# Patient Record
Sex: Female | Born: 1955 | Race: Black or African American | Hispanic: No | Marital: Married | Smoking: Never smoker
Health system: Southern US, Community
[De-identification: ages and names within clinical notes are randomized; demographics above are authoritative.]

## PROBLEM LIST (undated history)

## (undated) DIAGNOSIS — I10 Essential (primary) hypertension: Secondary | ICD-10-CM

## (undated) HISTORY — PX: TONSILLECTOMY: SUR1361

## (undated) HISTORY — PX: APPENDECTOMY: SHX54

## (undated) HISTORY — PX: CHOLECYSTECTOMY: SHX55

---

## 2014-04-22 ENCOUNTER — Other Ambulatory Visit: Payer: Self-pay | Admitting: Family Medicine

## 2014-04-22 DIAGNOSIS — R1031 Right lower quadrant pain: Secondary | ICD-10-CM

## 2014-05-07 ENCOUNTER — Ambulatory Visit
Admission: RE | Admit: 2014-05-07 | Discharge: 2014-05-07 | Disposition: A | Discharge status: Home / Self Care | Payer: Federal, State, Local not specified - PPO | Source: Ambulatory Visit | Attending: Family Medicine | Admitting: Family Medicine

## 2014-05-07 ENCOUNTER — Encounter (INDEPENDENT_AMBULATORY_CARE_PROVIDER_SITE_OTHER): Payer: Self-pay

## 2014-05-07 ENCOUNTER — Other Ambulatory Visit: Payer: Self-pay

## 2014-05-07 DIAGNOSIS — R1031 Right lower quadrant pain: Secondary | ICD-10-CM

## 2014-05-10 ENCOUNTER — Other Ambulatory Visit: Payer: Self-pay | Admitting: Family Medicine

## 2014-05-10 DIAGNOSIS — R160 Hepatomegaly, not elsewhere classified: Secondary | ICD-10-CM

## 2014-05-10 DIAGNOSIS — N83209 Unspecified ovarian cyst, unspecified side: Secondary | ICD-10-CM

## 2014-05-20 ENCOUNTER — Ambulatory Visit
Admission: RE | Admit: 2014-05-20 | Discharge: 2014-05-20 | Disposition: A | Discharge status: Home / Self Care | Payer: Federal, State, Local not specified - PPO | Source: Ambulatory Visit | Attending: Family Medicine | Admitting: Family Medicine

## 2014-05-20 DIAGNOSIS — R160 Hepatomegaly, not elsewhere classified: Secondary | ICD-10-CM

## 2014-05-20 DIAGNOSIS — N83209 Unspecified ovarian cyst, unspecified side: Secondary | ICD-10-CM

## 2014-05-20 MED ORDER — GADOBENATE DIMEGLUMINE 529 MG/ML IV SOLN
13.0000 mL | Freq: Once | INTRAVENOUS | Status: AC | PRN
  Administered 2014-05-20: 13 mL via INTRAVENOUS

## 2016-06-01 ENCOUNTER — Encounter (HOSPITAL_COMMUNITY): Payer: Self-pay

## 2016-06-01 ENCOUNTER — Emergency Department (HOSPITAL_COMMUNITY)
Admission: EM | Admit: 2016-06-01 | Discharge: 2016-06-02 | Disposition: A | Discharge status: Home / Self Care | Payer: Federal, State, Local not specified - PPO | Attending: Emergency Medicine | Admitting: Emergency Medicine

## 2016-06-01 ENCOUNTER — Emergency Department (HOSPITAL_COMMUNITY): Payer: Federal, State, Local not specified - PPO

## 2016-06-01 DIAGNOSIS — R51 Headache: Secondary | ICD-10-CM | POA: Diagnosis not present

## 2016-06-01 DIAGNOSIS — I10 Essential (primary) hypertension: Secondary | ICD-10-CM | POA: Diagnosis not present

## 2016-06-01 DIAGNOSIS — R519 Headache, unspecified: Secondary | ICD-10-CM

## 2016-06-01 HISTORY — DX: Essential (primary) hypertension: I10

## 2016-06-01 NOTE — ED Notes (Signed)
Pt presents to ER with c/o headache for the past few nights. Pt, however, denies any headache today or any headache currently. Pt free of complaints at this time.

## 2016-06-01 NOTE — ED Provider Notes (Signed)
[facility]PT Provider Note   CSN: [id number] Arrival date & time: 06/01/16  [id number]  First Provider Contact:  None       History   Chief Complaint Chief Complaint  Patient presents with  . Migraine    HPI Angela Rubio is a 60 y.o. female.  HPI Patient presents with left-sided headache for 2-3 days. Worse at nights. Gradual onset. Not associated with photophobia, nausea or vomiting. Denies any focal weakness or numbness. Denies nasal congestion, sinus pressure or sore throat. Has not had previously similar headaches. Does have a history of hypertension but states she's been compliant with her medication. Denies any trauma. No neck pain. Past Medical History:  Diagnosis Date  . Hypertension     There are no active problems to display for this patient.   Past Surgical History:  Procedure Laterality Date  . APPENDECTOMY    . CHOLECYSTECTOMY    . TONSILLECTOMY      OB History    No data available       Home Medications    Prior to Admission medications   Not on File    Family History History reviewed. No pertinent family history.  Social History Social History  Substance Use Topics  . Smoking status: Never Smoker  . Smokeless tobacco: Never Used  . Alcohol use No     Allergies   Review of patient's allergies indicates no known allergies.   Review of Systems Review of Systems  Constitutional: Negative for chills and fever.  HENT: Negative for congestion, sinus pressure and sore throat.   Eyes: Negative for photophobia and visual disturbance.  Respiratory: Negative for shortness of breath.   Cardiovascular: Negative for chest pain.  Gastrointestinal: Negative for abdominal pain, nausea and vomiting.  Musculoskeletal: Negative for back pain, gait problem and neck pain.  Skin: Negative for rash and wound.  Neurological: Positive for headaches. Negative for dizziness, syncope, weakness, light-headedness and numbness.  All other systems reviewed  and are negative.    Physical Exam Updated Vital Signs BP 156/90 (BP Location: Right Arm)   Pulse (!) 58   Temp 98.1 F (36.7 C) (Oral)   Resp 18   Ht 5\' 1"  (1.549 m)   Wt 145 lb 9.6 oz (66 kg)   SpO2 99%   BMI 27.51 kg/m   Physical Exam  Constitutional: She is oriented to person, place, and time. She appears well-developed and well-nourished.  HENT:  Head: Normocephalic and atraumatic.  Mouth/Throat: Oropharynx is clear and moist.  No scalp tenderness especially in the temporal artery region. No sinus tenderness with percussion.  Eyes: EOM are normal. Pupils are equal, round, and reactive to light.  Neck: Normal range of motion. Neck supple.  No meningismus  Cardiovascular: Normal rate and regular rhythm.  Exam reveals no gallop and no friction rub.   No murmur heard. Pulmonary/Chest: Effort normal and breath sounds normal. No respiratory distress. She has no wheezes. She has no rales. She exhibits no tenderness.  Abdominal: Soft. Bowel sounds are normal. There is no tenderness. There is no rebound and no guarding.  Musculoskeletal: Normal range of motion. She exhibits no edema or tenderness.  Neurological: She is alert and oriented to person, place, and time.  Patient is alert and oriented x3 with clear, goal oriented speech. Patient has 5/5 motor in all extremities. Sensation is intact to light touch. Bilateral finger-to-nose is normal with no signs of dysmetria. Patient has a normal gait and walks without assistance.  Skin: Skin  is warm and dry. No rash noted. No erythema.  Psychiatric: She has a normal mood and affect. Her behavior is normal.  Nursing note and vitals reviewed.    ED Treatments / Results  Labs (all labs ordered are listed, but only abnormal results are displayed) Labs Reviewed - No data to display  EKG  EKG Interpretation None       Radiology Ct Head Wo Contrast  Result Date: 06/02/2016 CLINICAL DATA:  Acute onset of sharp headache.  Initial  encounter. EXAM: CT HEAD WITHOUT CONTRAST TECHNIQUE: Contiguous axial images were obtained from the base of the skull through the vertex without intravenous contrast. COMPARISON:  None. FINDINGS: There is no evidence of acute infarction, mass lesion, or intra- or extra-axial hemorrhage on CT. The posterior fossa, including the cerebellum, brainstem and fourth ventricle, is within normal limits. The third and lateral ventricles, and basal ganglia are unremarkable in appearance. The cerebral hemispheres are symmetric in appearance, with normal gray-white differentiation. No mass effect or midline shift is seen. There is no evidence of fracture; visualized osseous structures are unremarkable in appearance. The orbits are within normal limits. The paranasal sinuses and mastoid air cells are well-aerated. No significant soft tissue abnormalities are seen. IMPRESSION: Unremarkable noncontrast CT of the head. Electronically Signed   By: Roanna Raider M.D.   On: 06/02/2016 01:00    Procedures Procedures (including critical care time)  Medications Ordered in ED Medications - No data to display   Initial Impression / Assessment and Plan / ED Course  I have reviewed the triage vital signs and the nursing notes.  Pertinent labs & imaging results that were available during my care of the patient were reviewed by me and considered in my medical decision making (see chart for details).  Clinical Course   Patient's well appearing. She has a normal neurologic exam. Low suspicion for intracranial abnormality. Given this is a new headache and patient's age will get imaging. Anticipate discharge home. Signed out to oncoming emergency physician pending CT.  Final Clinical Impressions(s) / ED Diagnoses   Final diagnoses:  Acute nonintractable headache, unspecified headache type    New Prescriptions There are no discharge medications for this patient.    Loren Racer, MD 06/03/16 1213

## 2016-06-01 NOTE — ED Notes (Signed)
Pt to CT at this time.

## 2016-06-01 NOTE — ED Triage Notes (Signed)
Onset 05-28-16 while laying down trying to sleep got headache on left side of head. Has happened nightly since.  Pt has not tried any medications.  Will get up during night, drink water and is able to return to sleep.  Pt feels Ok during day.  No other symptoms associated with headache.

## 2016-06-01 NOTE — ED Notes (Signed)
Pt OOB to bathroom

## 2016-06-02 NOTE — ED Notes (Signed)
Pt dc home with significant other 

## 2016-06-02 NOTE — Discharge Instructions (Signed)
Continue ibuprofen and tylenol as needed for pain.  Return for worsening symptoms, including worsening pain, intractable vomiting, new numbness weakness, vision or speech changes, difficulty walking or any other symptoms concerning to you.

## 2016-06-02 NOTE — ED Provider Notes (Signed)
Pending CT head at time of sign out. Ct head negative for acute processes. Patient to continue migraine treatment as outpatient.  Strict return and follow-up instructions reviewed. She expressed understanding of all discharge instructions and felt comfortable with the plan of care.    Lavera Guiseana Duo Cristol Engdahl, MD 06/02/16 [phone]

## 2016-06-04 DIAGNOSIS — I10 Essential (primary) hypertension: Secondary | ICD-10-CM | POA: Diagnosis not present

## 2016-06-04 DIAGNOSIS — M62838 Other muscle spasm: Secondary | ICD-10-CM | POA: Diagnosis not present

## 2016-10-13 DIAGNOSIS — J019 Acute sinusitis, unspecified: Secondary | ICD-10-CM | POA: Diagnosis not present

## 2016-12-01 ENCOUNTER — Telehealth: Payer: Self-pay

## 2016-12-01 NOTE — Telephone Encounter (Signed)
SENT NOTE TO SCHEDULING 

## 2016-12-10 ENCOUNTER — Ambulatory Visit: Payer: Federal, State, Local not specified - PPO | Admitting: Cardiovascular Disease

## 2017-04-04 DIAGNOSIS — E611 Iron deficiency: Secondary | ICD-10-CM | POA: Diagnosis not present

## 2017-04-04 DIAGNOSIS — E785 Hyperlipidemia, unspecified: Secondary | ICD-10-CM | POA: Diagnosis not present

## 2017-04-04 DIAGNOSIS — D72819 Decreased white blood cell count, unspecified: Secondary | ICD-10-CM | POA: Diagnosis not present

## 2017-04-04 DIAGNOSIS — I10 Essential (primary) hypertension: Secondary | ICD-10-CM | POA: Diagnosis not present

## 2017-04-04 DIAGNOSIS — R739 Hyperglycemia, unspecified: Secondary | ICD-10-CM | POA: Diagnosis not present

## 2017-10-03 DIAGNOSIS — Z79899 Other long term (current) drug therapy: Secondary | ICD-10-CM | POA: Diagnosis not present

## 2017-10-03 DIAGNOSIS — E876 Hypokalemia: Secondary | ICD-10-CM | POA: Diagnosis not present

## 2017-10-03 DIAGNOSIS — R0602 Shortness of breath: Secondary | ICD-10-CM | POA: Diagnosis not present

## 2017-10-03 DIAGNOSIS — I10 Essential (primary) hypertension: Secondary | ICD-10-CM | POA: Diagnosis not present

## 2018-11-27 DIAGNOSIS — Z6828 Body mass index (BMI) 28.0-28.9, adult: Secondary | ICD-10-CM | POA: Diagnosis not present

## 2018-11-27 DIAGNOSIS — I10 Essential (primary) hypertension: Secondary | ICD-10-CM | POA: Diagnosis not present

## 2019-01-23 DIAGNOSIS — R079 Chest pain, unspecified: Secondary | ICD-10-CM | POA: Diagnosis not present

## 2019-01-23 DIAGNOSIS — R05 Cough: Secondary | ICD-10-CM | POA: Diagnosis not present

## 2019-01-23 DIAGNOSIS — I10 Essential (primary) hypertension: Secondary | ICD-10-CM | POA: Diagnosis not present

## 2019-01-23 DIAGNOSIS — Z9119 Patient's noncompliance with other medical treatment and regimen: Secondary | ICD-10-CM | POA: Diagnosis not present

## 2019-01-23 DIAGNOSIS — Z9049 Acquired absence of other specified parts of digestive tract: Secondary | ICD-10-CM | POA: Diagnosis not present

## 2019-01-23 DIAGNOSIS — E876 Hypokalemia: Secondary | ICD-10-CM | POA: Diagnosis not present

## 2019-01-23 DIAGNOSIS — R0609 Other forms of dyspnea: Secondary | ICD-10-CM | POA: Diagnosis not present

## 2019-01-23 DIAGNOSIS — Z79899 Other long term (current) drug therapy: Secondary | ICD-10-CM | POA: Diagnosis not present

## 2019-01-23 DIAGNOSIS — R0600 Dyspnea, unspecified: Secondary | ICD-10-CM | POA: Diagnosis not present

## 2019-01-24 DIAGNOSIS — R079 Chest pain, unspecified: Secondary | ICD-10-CM | POA: Diagnosis not present

## 2019-01-24 DIAGNOSIS — R0609 Other forms of dyspnea: Secondary | ICD-10-CM | POA: Diagnosis not present

## 2019-01-24 DIAGNOSIS — I10 Essential (primary) hypertension: Secondary | ICD-10-CM | POA: Diagnosis not present

## 2019-01-24 DIAGNOSIS — E876 Hypokalemia: Secondary | ICD-10-CM | POA: Diagnosis not present

## 2019-01-31 DIAGNOSIS — Z09 Encounter for follow-up examination after completed treatment for conditions other than malignant neoplasm: Secondary | ICD-10-CM | POA: Diagnosis not present

## 2019-01-31 DIAGNOSIS — J301 Allergic rhinitis due to pollen: Secondary | ICD-10-CM | POA: Diagnosis not present

## 2019-01-31 DIAGNOSIS — I10 Essential (primary) hypertension: Secondary | ICD-10-CM | POA: Diagnosis not present

## 2019-05-30 DIAGNOSIS — Z136 Encounter for screening for cardiovascular disorders: Secondary | ICD-10-CM | POA: Diagnosis not present

## 2019-05-30 DIAGNOSIS — R7302 Impaired glucose tolerance (oral): Secondary | ICD-10-CM | POA: Diagnosis not present

## 2019-05-30 DIAGNOSIS — Z0000 Encounter for general adult medical examination without abnormal findings: Secondary | ICD-10-CM | POA: Diagnosis not present

## 2019-05-30 DIAGNOSIS — Z7289 Other problems related to lifestyle: Secondary | ICD-10-CM | POA: Diagnosis not present

## 2019-05-30 DIAGNOSIS — Z13220 Encounter for screening for lipoid disorders: Secondary | ICD-10-CM | POA: Diagnosis not present

## 2019-05-30 DIAGNOSIS — J8410 Pulmonary fibrosis, unspecified: Secondary | ICD-10-CM | POA: Diagnosis not present

## 2019-07-09 DIAGNOSIS — Z1231 Encounter for screening mammogram for malignant neoplasm of breast: Secondary | ICD-10-CM | POA: Diagnosis not present

## 2019-07-25 ENCOUNTER — Other Ambulatory Visit: Payer: Self-pay | Admitting: Family Medicine

## 2019-07-25 DIAGNOSIS — R921 Mammographic calcification found on diagnostic imaging of breast: Secondary | ICD-10-CM | POA: Diagnosis not present

## 2019-07-30 ENCOUNTER — Ambulatory Visit
Admission: RE | Admit: 2019-07-30 | Discharge: 2019-07-30 | Disposition: A | Discharge status: Home / Self Care | Payer: Federal, State, Local not specified - PPO | Source: Ambulatory Visit | Attending: Family Medicine | Admitting: Family Medicine

## 2019-07-30 ENCOUNTER — Other Ambulatory Visit: Payer: Self-pay

## 2019-07-30 DIAGNOSIS — R921 Mammographic calcification found on diagnostic imaging of breast: Secondary | ICD-10-CM

## 2019-07-30 DIAGNOSIS — N6489 Other specified disorders of breast: Secondary | ICD-10-CM | POA: Diagnosis not present

## 2019-09-25 DIAGNOSIS — J8410 Pulmonary fibrosis, unspecified: Secondary | ICD-10-CM | POA: Diagnosis not present

## 2019-09-25 DIAGNOSIS — R05 Cough: Secondary | ICD-10-CM | POA: Diagnosis not present

## 2019-10-18 ENCOUNTER — Other Ambulatory Visit: Payer: Self-pay

## 2019-10-18 ENCOUNTER — Ambulatory Visit (INDEPENDENT_AMBULATORY_CARE_PROVIDER_SITE_OTHER): Payer: Federal, State, Local not specified - PPO | Admitting: Internal Medicine

## 2019-10-18 ENCOUNTER — Ambulatory Visit (INDEPENDENT_AMBULATORY_CARE_PROVIDER_SITE_OTHER): Payer: Federal, State, Local not specified - PPO

## 2019-10-18 ENCOUNTER — Encounter: Payer: Self-pay | Admitting: Internal Medicine

## 2019-10-18 VITALS — BP 122/82 | HR 83 | Temp 97.0°F | Ht 61.5 in | Wt 145.0 lb

## 2019-10-18 DIAGNOSIS — J42 Unspecified chronic bronchitis: Secondary | ICD-10-CM | POA: Diagnosis not present

## 2019-10-18 DIAGNOSIS — R059 Cough, unspecified: Secondary | ICD-10-CM

## 2019-10-18 DIAGNOSIS — R05 Cough: Secondary | ICD-10-CM

## 2019-10-18 DIAGNOSIS — R9389 Abnormal findings on diagnostic imaging of other specified body structures: Secondary | ICD-10-CM | POA: Diagnosis not present

## 2019-10-18 MED ORDER — BREO ELLIPTA 100-25 MCG/INH IN AEPB: 1.0000 | INHALATION_SPRAY | Freq: Every day | RESPIRATORY_TRACT | 0 refills | Status: AC

## 2019-10-18 MED ORDER — MONTELUKAST SODIUM 10 MG PO TABS: 10.0000 mg | ORAL_TABLET | Freq: Every day | ORAL | 11 refills | Status: AC

## 2019-10-18 NOTE — Patient Instructions (Addendum)
-   Try singulair for 1 week.  If cough not significantly better, start taking breo sample 1 puff daily (rinse mouth out or brush teeth after using)  - You can stop the flonase  - Test for exposure to tuberculosis exposure in past (quantiferon test)  - CT Chest to evaluate "calcified adenopathy" and "calcified granuloma"  - Touch base in 1 month over phone so we can review the CT and how the cough is doing  Overall I think the lung findings are benign and won't be an issue. The cough is probably a postinfectious bronchitis that should ease up over time.  We are doing a more aggressive workup because you have the abnormal x-ray at the same time as the cough.

## 2019-10-18 NOTE — Progress Notes (Signed)
Synopsis: Referred in 10/18/19 for abnormal imaging by Sandi Mealy, MD  Subjective:   PATIENT ID: Angela Rubio GENDER: female DOB: 02-23-1956, MRN: [id number]  Chief Complaint  Patient presents with  . Consult    Patient has had dry cough for past 4 months. Patient states that when the weather changed it got better. Heat makes it worse.    HPI 63 year old woman presenting for evaluation of abnormal imaging found during workup for cough.  Cough - Had flu like illness in Spring but not clear lingering cough after - In August, nagging nonproductive cough, non-paroxysmal, worse in dry weather - No associated DOE, wheezing, postnasal drip - Mild GERD - No history of asthma - No benefit to flonase  Granulomatous lung disease - No obvious exposure history to TB - No occupational exposures in herself or husband ([place]) - Does travel to [place] and [place] frequently, also used to live in [place] - No family history of sarcoidosis - Only B symptoms are from menopause and intermittent fasting (weight loss, fatigue, night sweats) - MMRC 0, works out daily  ROS Positive Symptoms in bold:  Constitutional fevers, chills, weight loss, fatigue, anorexia, malaise  Eyes decreased vision, double vision, eye irritation  Ears, Nose, Mouth, Throat sore throat, trouble swallowing, sinus congestion  Cardiovascular chest pain, paroxysmal nocturnal dyspnea, lower ext edema, palpitations   Respiratory SOB, cough, DOE, hemoptysis, wheezing  Gastrointestinal nausea, vomiting, diarrhea  Genitourinary burning with urination, trouble urinating  Musculoskeletal joint aches, joint swelling, back pain  Integumentary  rashes, skin lesions  Neurological focal weakness, focal numbness, trouble speaking, headaches  Psychiatric depression, anxiety, confusion  Endocrine polyuria, polydipsia, cold intolerance, heat intolerance  Hematologic abnormal bruising, abnormal bleeding, unexplained nose  bleeds  Allergic/Immunologic recurrent infections, hives, swollen lymph nodes    Past Medical History:  Diagnosis Date  . Hypertension      Family History  Problem Relation Age of Onset  . Alcoholism Father   . Cancer Sister   . Breast cancer Sister   . Prostate cancer Brother   . Kidney disease Sister      Past Surgical History:  Procedure Laterality Date  . APPENDECTOMY    . CHOLECYSTECTOMY    . TONSILLECTOMY      Social History   Socioeconomic History  . Marital status: Married    Spouse name: Not on file  . Number of children: Not on file  . Years of education: Not on file  . Highest education level: Not on file  Occupational History  . Not on file  Tobacco Use  . Smoking status: Never Smoker  . Smokeless tobacco: Never Used  Substance and Sexual Activity  . Alcohol use: No  . Drug use: No  . Sexual activity: Not on file  Other Topics Concern  . Not on file  Social History Narrative  . Not on file   Social Determinants of Health   Financial Resource Strain:   . Difficulty of Paying Living Expenses: Not on file  Food Insecurity:   . Worried About Charity fundraiser in the Last Year: Not on file  . Ran Out of Food in the Last Year: Not on file  Transportation Needs:   . Lack of Transportation (Medical): Not on file  . Lack of Transportation (Non-Medical): Not on file  Physical Activity:   . Days of Exercise per Week: Not on file  . Minutes of Exercise per Session: Not on file  Stress:   .  Feeling of Stress : Not on file  Social Connections:   . Frequency of Communication with Friends and Family: Not on file  . Frequency of Social Gatherings with Friends and Family: Not on file  . Attends Religious Services: Not on file  . Active Member of Clubs or Organizations: Not on file  . Attends Banker Meetings: Not on file  . Marital Status: Not on file  Intimate Partner Violence:   . Fear of Current or Ex-Partner: Not on file  .  Emotionally Abused: Not on file  . Physically Abused: Not on file  . Sexually Abused: Not on file     Allergies  Allergen Reactions  . Gramineae Pollens      Outpatient Medications Prior to Visit  Medication Sig Dispense Refill  . Blood Pressure Monitoring (OMRON 5 SERIES BP MONITOR) DEVI     . hydrochlorothiazide (HYDRODIURIL) 25 MG tablet Take 25 mg by mouth daily.    . Multiple Vitamin (MULTI-VITAMIN) tablet Take by mouth.    . Potassium Gluconate 2.5 MEQ TABS Take 1 tablet by mouth daily.     No facility-administered medications prior to visit.     Objective:  GEN: appears younger than stated age HEENT: MMM, no pharyngeal cobblestoning, trachea midline CV: heart sounds regular, ext warm PULM: Clear without wheezing, no accessory muscle use GI: Soft, +BS EXT: No edema NEURO: normal ambulation, moves all 4 ext PSYCH: AOx3, excellent insight SKIN: no rashes    Vitals:   10/18/19 0903  BP: 122/82  Pulse: 83  Temp: (!) 97 F (36.1 C)  TempSrc: Temporal  SpO2: 97%  Weight: 145 lb (65.8 kg)  Height: 5' 1.5" (1.562 m)   97% on  RA BMI Readings from Last 3 Encounters:  10/18/19 26.95 kg/m  06/01/16 27.51 kg/m   Wt Readings from Last 3 Encounters:  10/18/19 145 lb (65.8 kg)  06/01/16 145 lb 9.6 oz (66 kg)   CXR in office- upper lobe predominant scarring and calcified hilar adenopathy    Assessment & Plan:  Gestalt is that cough is unrelated to CXR.  # Cough/ chronic bronchitis - Differential is postinfectious bronchitis vs. GERD-associated - Start with singulair, if fixes just continue this for a couple months - If singulair does not work, breo x 1 month - Touch base in 1 month, if breo not working, d/c it Engineer, civil (consulting) and start omeprazole, consider IgE and CBC  # Abnormal chest x-ray - Differential includes prior fungal infection, TB, sarcoid - Will evaluate extent of parenchymal involvement with CT and if any obvious areas of active inflammation that  may warrant more workup - Check quantiferon - If CT shows old disease and quantiferon negative, nothing further to do here   Current Outpatient Medications:  .  Blood Pressure Monitoring (OMRON 5 SERIES BP MONITOR) DEVI, , Disp: , Rfl:  .  hydrochlorothiazide (HYDRODIURIL) 25 MG tablet, Take 25 mg by mouth daily., Disp: , Rfl:  .  montelukast (SINGULAIR) 10 MG tablet, Take 1 tablet (10 mg total) by mouth daily., Disp: 30 tablet, Rfl: 11 .  Multiple Vitamin (MULTI-VITAMIN) tablet, Take by mouth., Disp: , Rfl:  .  Potassium Gluconate 2.5 MEQ TABS, Take 1 tablet by mouth daily., Disp: , Rfl:    Lorin Glass, MD [facility] 10/18/2019 9:32 AM

## 2019-10-18 NOTE — Addendum Note (Signed)
Addended by: Lia Foyer R on: 10/18/2019 09:43 AM   Modules accepted: Orders

## 2019-10-21 LAB — QUANTIFERON-TB GOLD PLUS
Mitogen-NIL: >10 IU/mL
NIL: 0.03 IU/mL
QuantiFERON-TB Gold Plus: NEGATIVE
TB1-NIL: 0 IU/mL
TB2-NIL: 0.01 IU/mL

## 2019-11-09 ENCOUNTER — Other Ambulatory Visit: Payer: Self-pay

## 2019-11-09 ENCOUNTER — Ambulatory Visit (INDEPENDENT_AMBULATORY_CARE_PROVIDER_SITE_OTHER)
Admission: RE | Admit: 2019-11-09 | Discharge: 2019-11-09 | Disposition: A | Discharge status: Home / Self Care | Payer: Federal, State, Local not specified - PPO | Source: Ambulatory Visit | Attending: Internal Medicine | Admitting: Internal Medicine

## 2019-11-09 DIAGNOSIS — R05 Cough: Secondary | ICD-10-CM

## 2019-11-09 DIAGNOSIS — R059 Cough, unspecified: Secondary | ICD-10-CM

## 2019-11-09 DIAGNOSIS — R9389 Abnormal findings on diagnostic imaging of other specified body structures: Secondary | ICD-10-CM | POA: Diagnosis not present

## 2019-11-14 ENCOUNTER — Ambulatory Visit (INDEPENDENT_AMBULATORY_CARE_PROVIDER_SITE_OTHER): Payer: Federal, State, Local not specified - PPO | Admitting: Internal Medicine

## 2019-11-14 ENCOUNTER — Other Ambulatory Visit: Payer: Self-pay

## 2019-11-14 DIAGNOSIS — R05 Cough: Secondary | ICD-10-CM | POA: Diagnosis not present

## 2019-11-14 DIAGNOSIS — R059 Cough, unspecified: Secondary | ICD-10-CM

## 2019-11-14 DIAGNOSIS — R9389 Abnormal findings on diagnostic imaging of other specified body structures: Secondary | ICD-10-CM | POA: Diagnosis not present

## 2019-11-14 NOTE — Patient Instructions (Signed)
Follow up PRN if breathing gets worse.

## 2019-11-14 NOTE — Progress Notes (Signed)
Pulmonary Followup  Followup abnormal CT and cough. Due to COVID pandemic, visit done over phone.  S: Last visit, Rx singulair- did not tolerate. Did her own research, bought the item below which resolved her cough.   Not much I can find regarding components in this supplement nor research for its effectiveness.  O: Sounds well on phone. Fair insight.  A: # Bronchitis- sounds like UACS.  Has found relief with homeopathic regimen.  Cannot really comment on safety or effectiveness of this medicine. # Abnormal CT chest- CT here shows stable calcified lymph nodes, old scarring, no active inflammation, do not think related to cough nor do I think there is any indication to follow this serially with imaging or PFTs.  She was told to monitor her breathing and reach out if it becomes an issue down the line.  She can follow up PRN.

## 2021-01-28 ENCOUNTER — Other Ambulatory Visit: Payer: Self-pay | Admitting: Family Medicine

## 2021-01-28 DIAGNOSIS — R921 Mammographic calcification found on diagnostic imaging of breast: Secondary | ICD-10-CM

## 2021-02-11 ENCOUNTER — Ambulatory Visit
Admission: RE | Admit: 2021-02-11 | Discharge: 2021-02-11 | Disposition: A | Discharge status: Home / Self Care | Payer: Federal, State, Local not specified - PPO | Source: Ambulatory Visit | Attending: Family Medicine | Admitting: Family Medicine

## 2021-02-11 ENCOUNTER — Other Ambulatory Visit: Payer: Self-pay

## 2021-02-11 DIAGNOSIS — R921 Mammographic calcification found on diagnostic imaging of breast: Secondary | ICD-10-CM

## 2022-04-07 ENCOUNTER — Other Ambulatory Visit: Payer: Self-pay | Admitting: Family Medicine

## 2022-04-07 DIAGNOSIS — Z1231 Encounter for screening mammogram for malignant neoplasm of breast: Secondary | ICD-10-CM

## 2022-04-12 ENCOUNTER — Ambulatory Visit
Admission: RE | Admit: 2022-04-12 | Discharge: 2022-04-12 | Disposition: A | Discharge status: Home / Self Care | Payer: Federal, State, Local not specified - PPO | Source: Ambulatory Visit | Attending: Family Medicine | Admitting: Family Medicine

## 2022-04-12 DIAGNOSIS — Z1231 Encounter for screening mammogram for malignant neoplasm of breast: Secondary | ICD-10-CM

## 2024-05-21 IMAGING — MG MM DIGITAL SCREENING BILAT W/ TOMO AND CAD
8 series · 8 of 24 positions shown · non-contrast
Comparison: Previous exam(s).

CLINICAL DATA: Screening.

EXAM:
DIGITAL SCREENING BILATERAL MAMMOGRAM WITH TOMOSYNTHESIS AND CAD
TECHNIQUE: Bilateral screening digital craniocaudal and mediolateral oblique
mammograms were obtained. Bilateral screening digital breast
tomosynthesis was performed. The images were evaluated with
computer-aided detection.

[R MLO synth-2D]
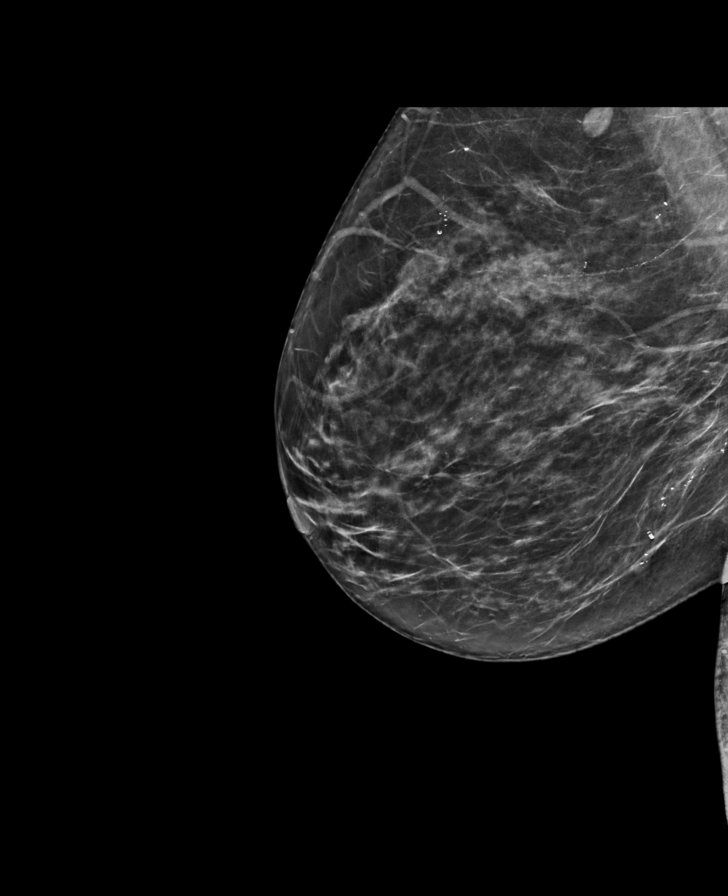

[L CC synth-2D]
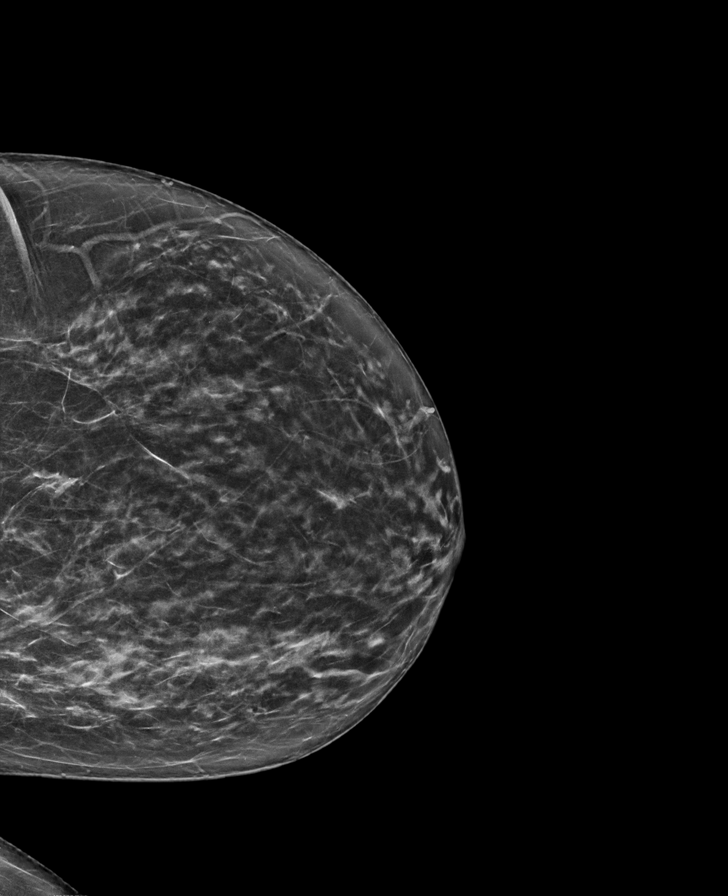

[R CC synth-2D]
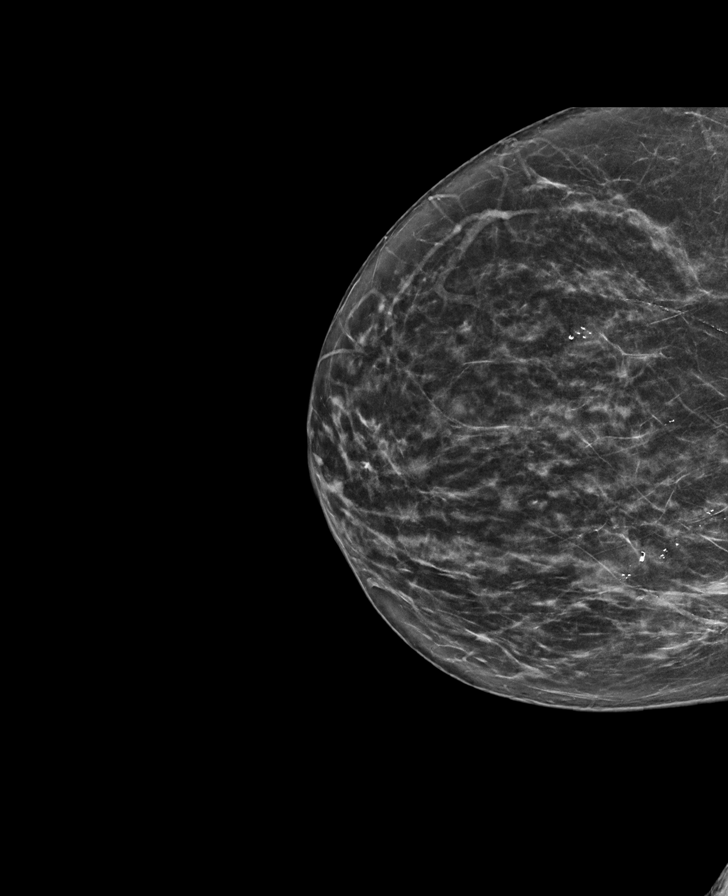

[L MLO synth-2D]
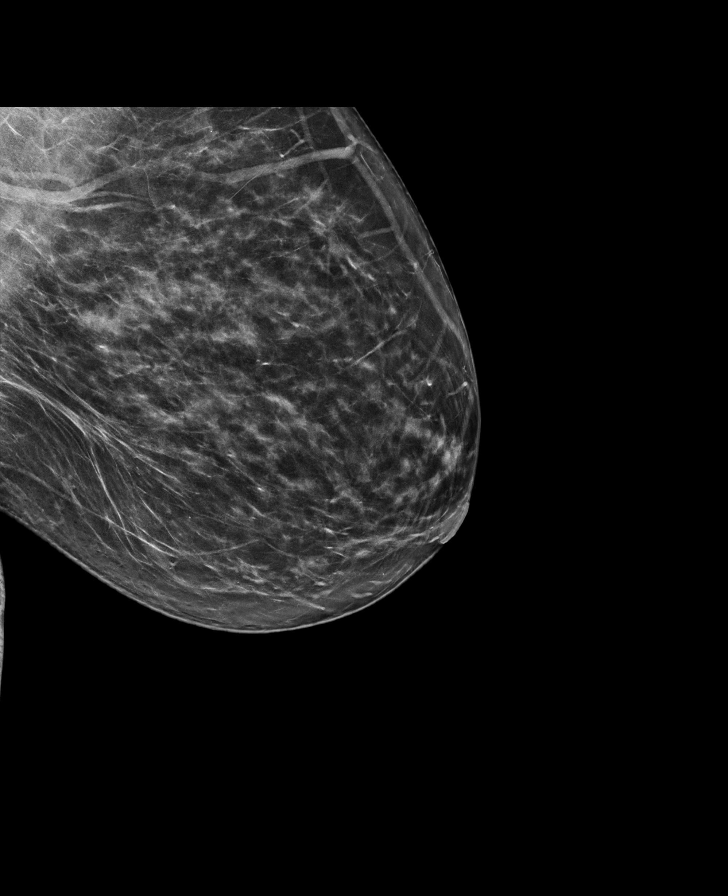

[L CC tomo · tomo slice 33/66.0]
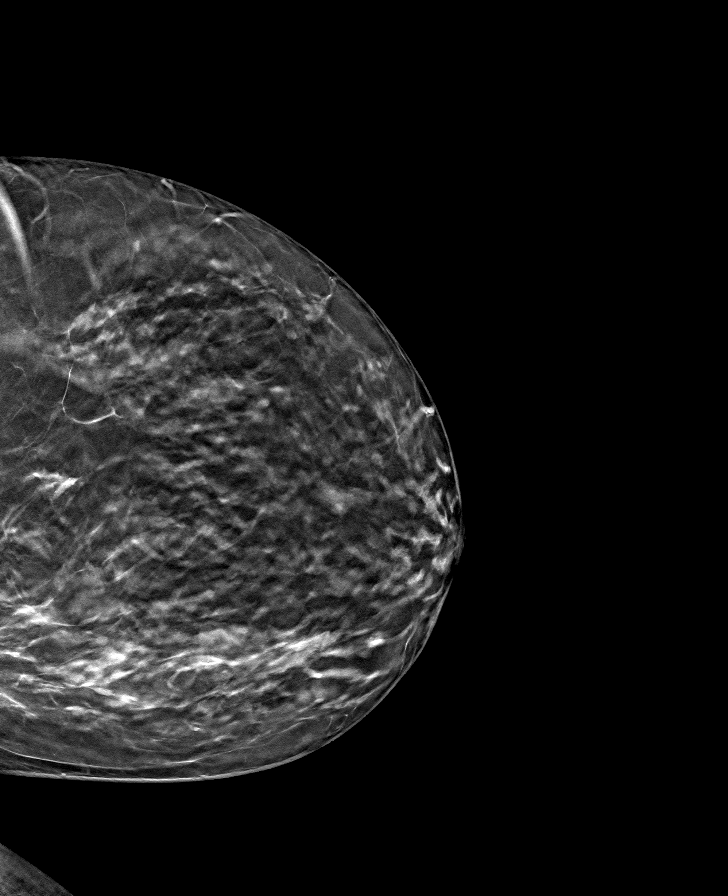

[L MLO tomo · tomo slice 38/75.0]
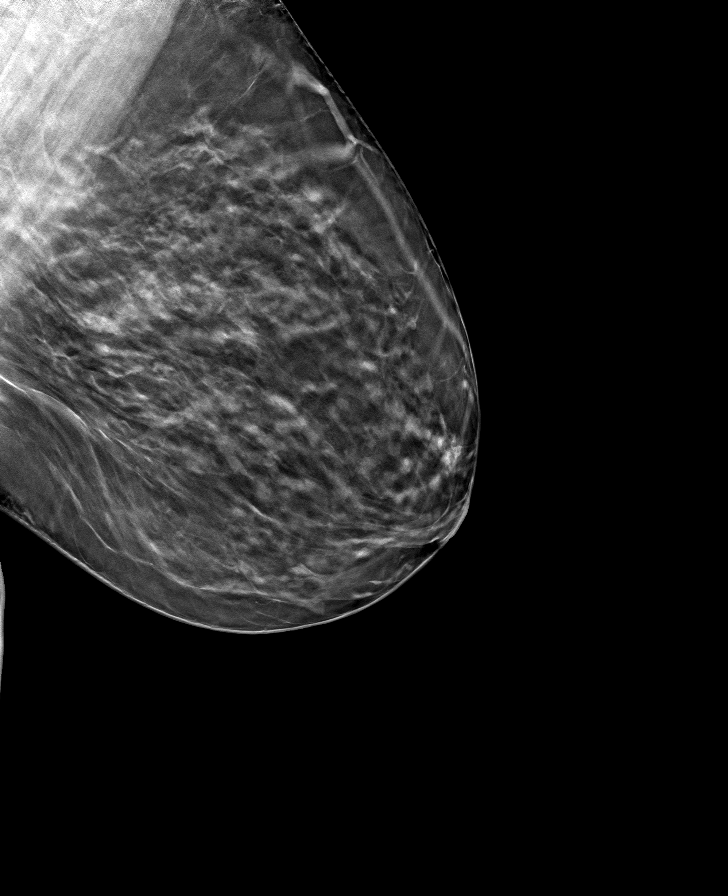

[R CC tomo · tomo slice 33/65.0]
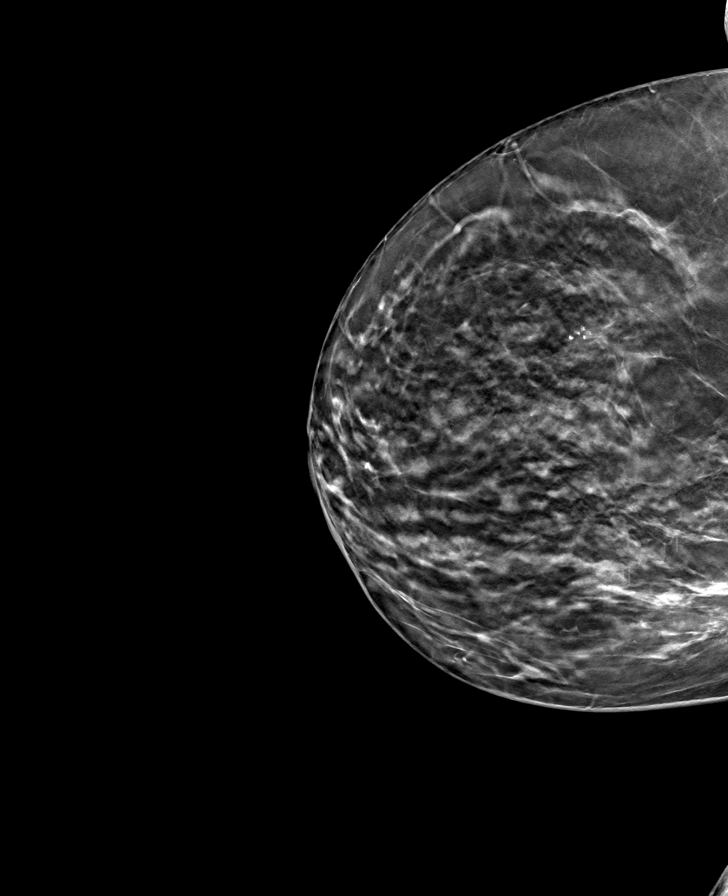

[R MLO tomo · tomo slice 37/73.0]
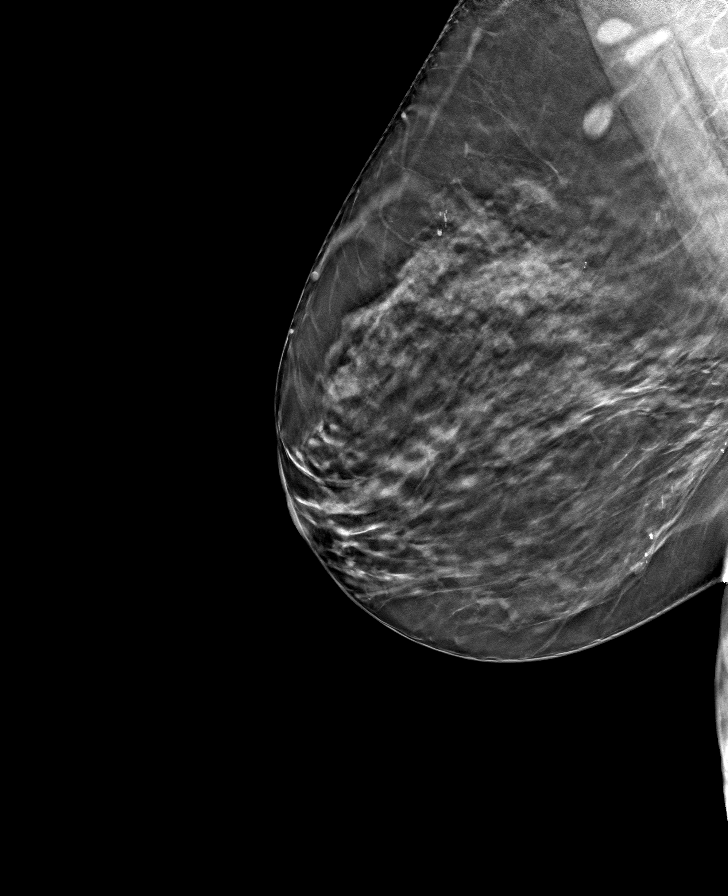

[8 of 24 positions shown; findings below may reference images not displayed]

ACR Breast Density Category c: The breast tissue is heterogeneously
dense, which may obscure small masses.
FINDINGS: There are no findings suspicious for malignancy.
IMPRESSION: No mammographic evidence of malignancy. A result letter of this
screening mammogram will be mailed directly to the patient.

RECOMMENDATION:
Screening mammogram in one year. (Code:Q3-W-BC3)

BI-RADS CATEGORY  1: Negative.
